# Patient Record
Sex: Female | Born: 1975
Health system: Southern US, Community
[De-identification: ages and names within clinical notes are randomized; demographics above are authoritative.]

---

## 2001-10-06 ENCOUNTER — Other Ambulatory Visit: Admission: RE | Admit: 2001-10-06 | Discharge: 2001-10-06 | Payer: Self-pay | Admitting: Obstetrics and Gynecology

## 2003-03-14 ENCOUNTER — Other Ambulatory Visit: Admission: RE | Admit: 2003-03-14 | Discharge: 2003-03-14 | Payer: Self-pay | Admitting: Obstetrics and Gynecology

## 2004-05-17 ENCOUNTER — Inpatient Hospital Stay (HOSPITAL_COMMUNITY): Admission: AD | Admit: 2004-05-17 | Discharge: 2004-05-18 | Payer: Self-pay | Admitting: Obstetrics and Gynecology

## 2004-05-18 ENCOUNTER — Inpatient Hospital Stay (HOSPITAL_COMMUNITY): Admission: AD | Admit: 2004-05-18 | Discharge: 2004-05-21 | Payer: Self-pay | Admitting: Obstetrics and Gynecology

## 2004-08-02 ENCOUNTER — Inpatient Hospital Stay (HOSPITAL_COMMUNITY): Admission: AD | Admit: 2004-08-02 | Discharge: 2004-08-02 | Payer: Self-pay | Admitting: Obstetrics and Gynecology

## 2004-08-16 ENCOUNTER — Encounter: Admission: RE | Admit: 2004-08-16 | Discharge: 2004-09-15 | Payer: Self-pay | Admitting: Obstetrics and Gynecology

## 2004-08-22 ENCOUNTER — Encounter: Admission: RE | Admit: 2004-08-22 | Discharge: 2004-08-22 | Payer: Self-pay | Admitting: Obstetrics and Gynecology

## 2004-10-11 ENCOUNTER — Other Ambulatory Visit: Admission: RE | Admit: 2004-10-11 | Discharge: 2004-10-11 | Payer: Self-pay | Admitting: Obstetrics and Gynecology

## 2010-09-26 ENCOUNTER — Encounter (HOSPITAL_COMMUNITY): Payer: Self-pay

## 2010-09-26 ENCOUNTER — Emergency Department (HOSPITAL_COMMUNITY)
Admission: EM | Admit: 2010-09-26 | Discharge: 2010-09-26 | Disposition: A | Discharge status: Home / Self Care | Payer: 59 | Attending: Emergency Medicine | Admitting: Emergency Medicine

## 2010-09-26 ENCOUNTER — Emergency Department (HOSPITAL_COMMUNITY): Payer: 59

## 2010-09-26 DIAGNOSIS — R002 Palpitations: Secondary | ICD-10-CM | POA: Insufficient documentation

## 2010-09-26 LAB — URINALYSIS, ROUTINE W REFLEX MICROSCOPIC
Ketones, ur: NEGATIVE mg/dL
Nitrite: NEGATIVE
Protein, ur: NEGATIVE mg/dL
Urine Glucose, Fasting: NEGATIVE mg/dL
Urobilinogen, UA: 0.2 mg/dL (ref 0.0–1.0)

## 2010-09-26 LAB — BASIC METABOLIC PANEL
BUN: 10 mg/dL (ref 6–23)
Chloride: 106 mEq/L (ref 96–112)
Creatinine, Ser: 0.73 mg/dL (ref 0.4–1.2)
GFR calc non Af Amer: >60 mL/min (ref >60)
Glucose, Bld: 100 mg/dL — ABNORMAL HIGH (ref 70–99)
Potassium: 3.7 mEq/L (ref 3.5–5.1)

## 2010-09-26 LAB — DIFFERENTIAL
Basophils Relative: 0 % (ref 0–1)
Eosinophils Absolute: 0.1 10*3/uL (ref 0.0–0.7)
Eosinophils Relative: 1 % (ref 0–5)
Lymphs Abs: 3 10*3/uL (ref 0.7–4.0)
Neutrophils Relative %: 49 % (ref 43–77)

## 2010-09-26 LAB — CBC
MCV: 90.5 fL (ref 78.0–100.0)
Platelets: 268 10*3/uL (ref 150–400)
RBC: 4.33 MIL/uL (ref 3.87–5.11)
RDW: 13.3 % (ref 11.5–15.5)
WBC: 6.5 10*3/uL (ref 4.0–10.5)

## 2010-09-26 LAB — POCT CARDIAC MARKERS: CKMB, poc: <1 ng/mL — ABNORMAL LOW (ref 1.0–8.0)

## 2010-09-26 LAB — POCT PREGNANCY, URINE: Preg Test, Ur: NEGATIVE

## 2011-02-26 ENCOUNTER — Other Ambulatory Visit: Payer: Self-pay | Admitting: Family Medicine

## 2011-02-27 ENCOUNTER — Ambulatory Visit
Admission: RE | Admit: 2011-02-27 | Discharge: 2011-02-27 | Disposition: A | Discharge status: Home / Self Care | Payer: 59 | Source: Ambulatory Visit | Attending: Family Medicine | Admitting: Family Medicine

## 2011-04-01 ENCOUNTER — Emergency Department (HOSPITAL_COMMUNITY)
Admission: EM | Admit: 2011-04-01 | Discharge: 2011-04-01 | Disposition: A | Discharge status: Home / Self Care | Payer: 59 | Attending: Emergency Medicine | Admitting: Emergency Medicine

## 2011-04-01 DIAGNOSIS — R079 Chest pain, unspecified: Secondary | ICD-10-CM | POA: Insufficient documentation

## 2011-04-01 DIAGNOSIS — R109 Unspecified abdominal pain: Secondary | ICD-10-CM | POA: Insufficient documentation

## 2011-04-01 DIAGNOSIS — R0609 Other forms of dyspnea: Secondary | ICD-10-CM | POA: Insufficient documentation

## 2011-04-01 DIAGNOSIS — R0989 Other specified symptoms and signs involving the circulatory and respiratory systems: Secondary | ICD-10-CM | POA: Insufficient documentation

## 2013-01-12 IMAGING — US US ABDOMEN COMPLETE
1 series · 14 of 25 positions shown · non-contrast
Comparison: None.

CLINICAL DATA: Abdominal pain.  Question gallstones.

COMPLETE ABDOMINAL ULTRASOUND

[Series 1: us abdomen complete · 0.19mm/px · 14 of 64 slices shown]
[im 1/64]
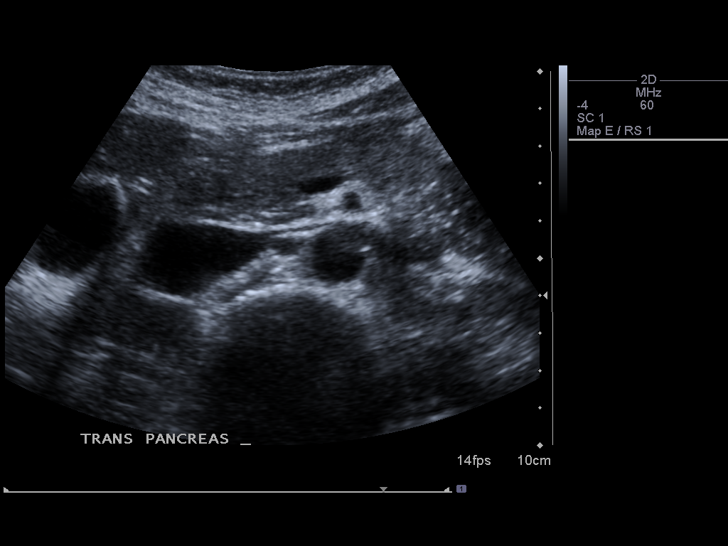
[im 6/64]
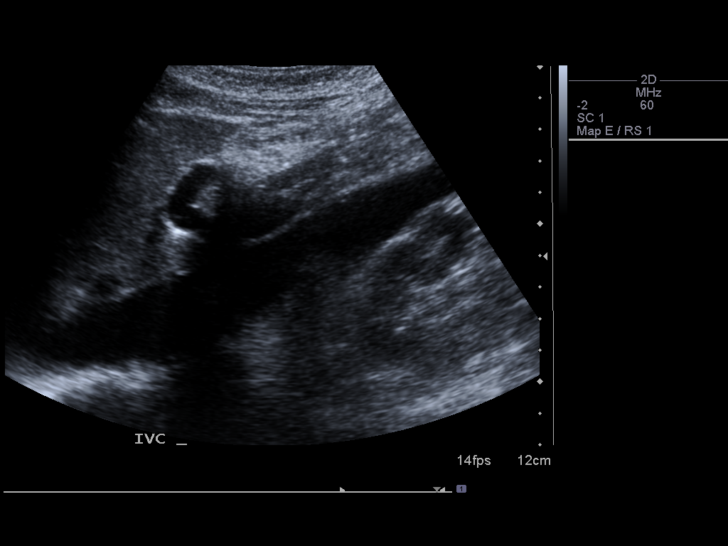
[im 11/64]
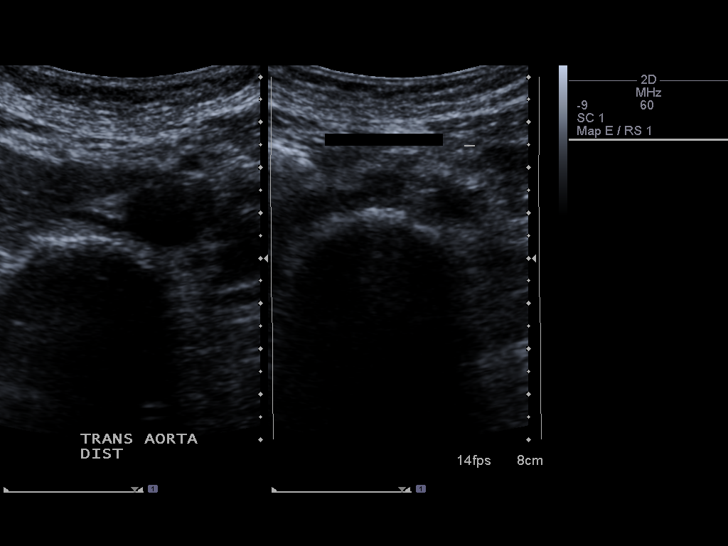
[im 16/64]
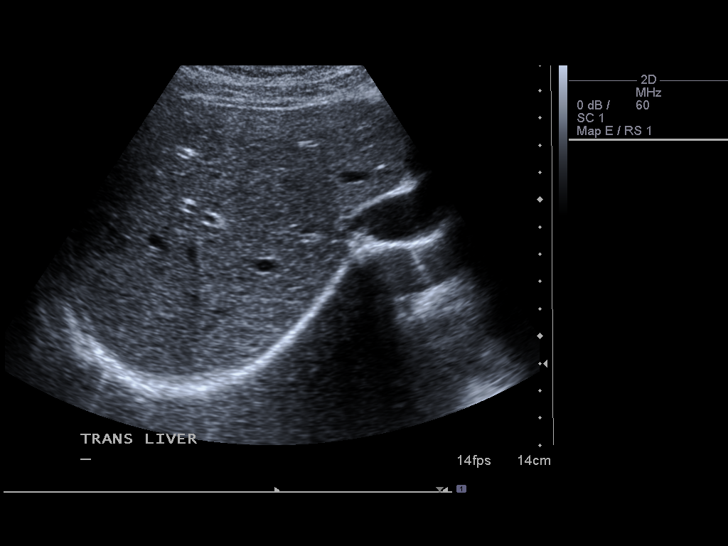
[im 22/64]
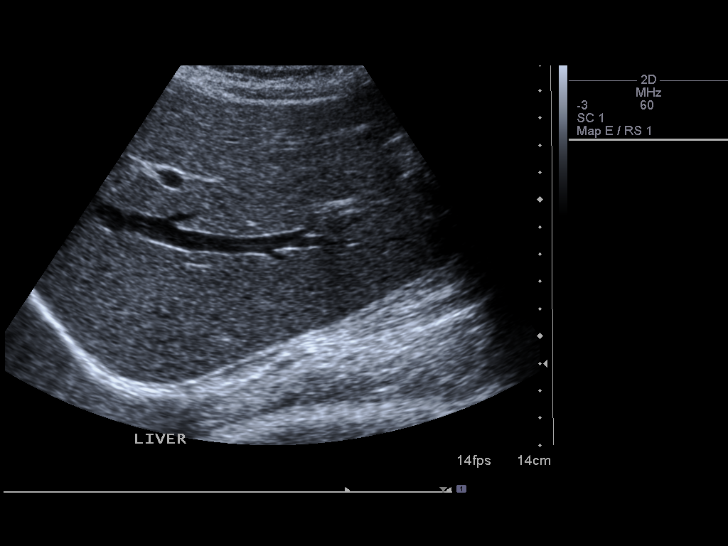
[im 24/64]
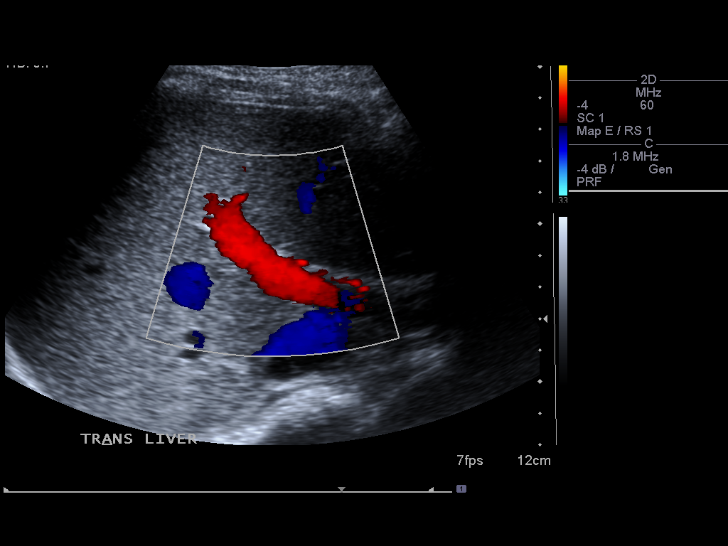
[im 29/64]
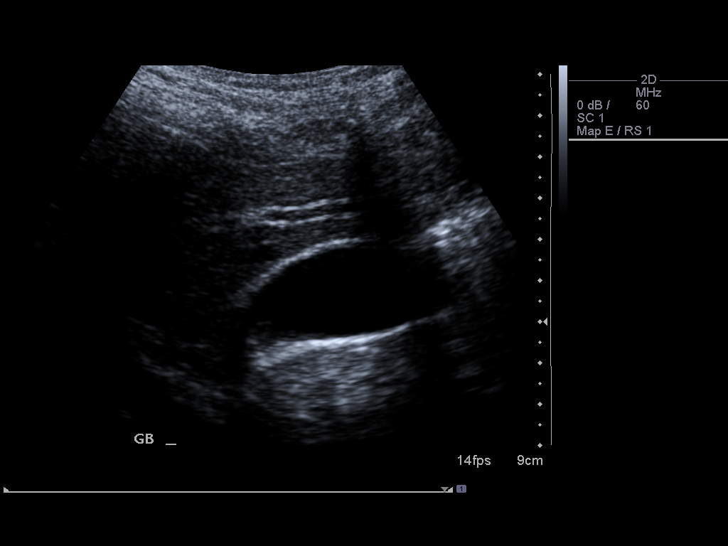
[im 35/64]
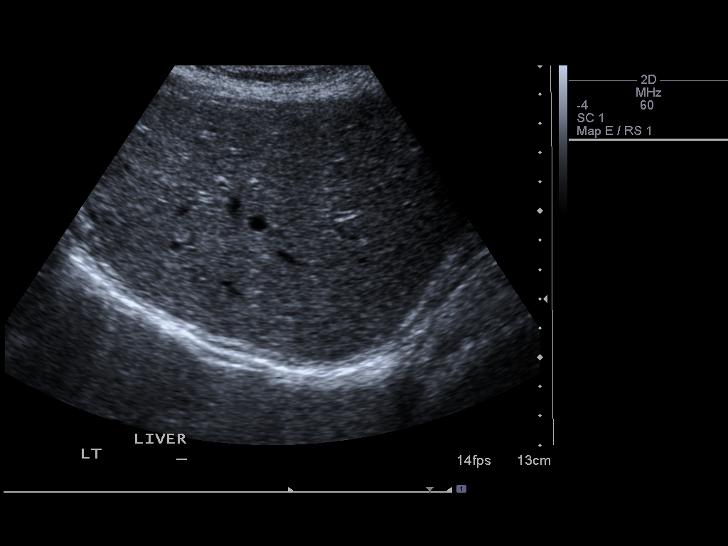
[im 40/64]
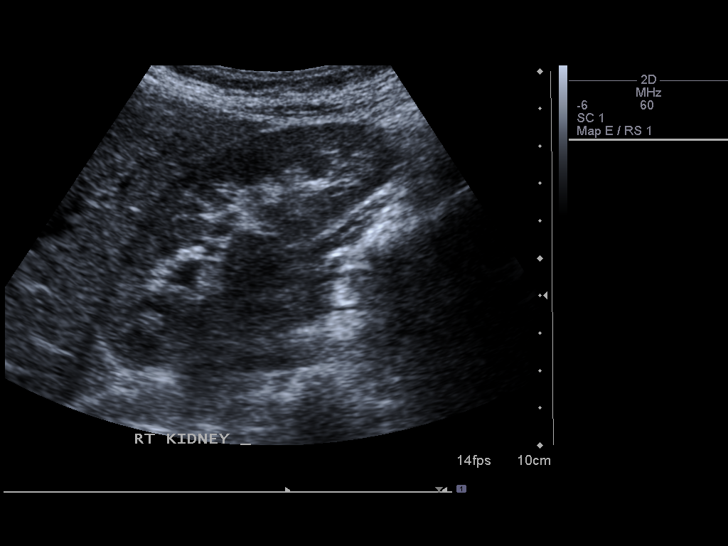
[im 43/64]
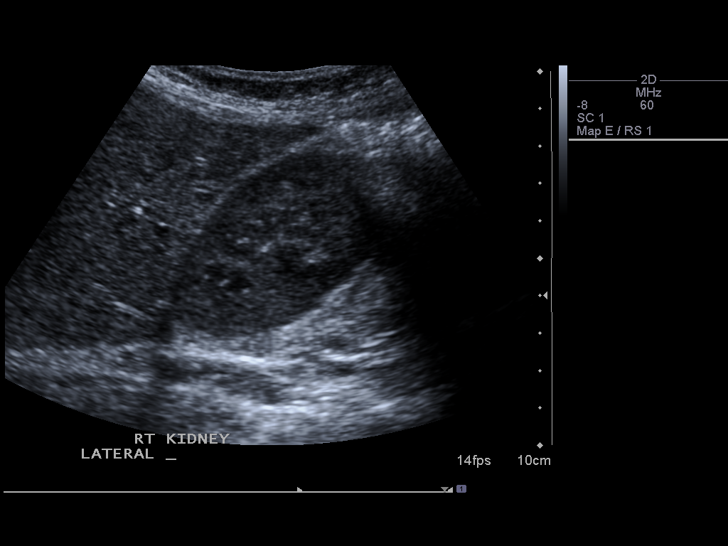
[im 48/64]
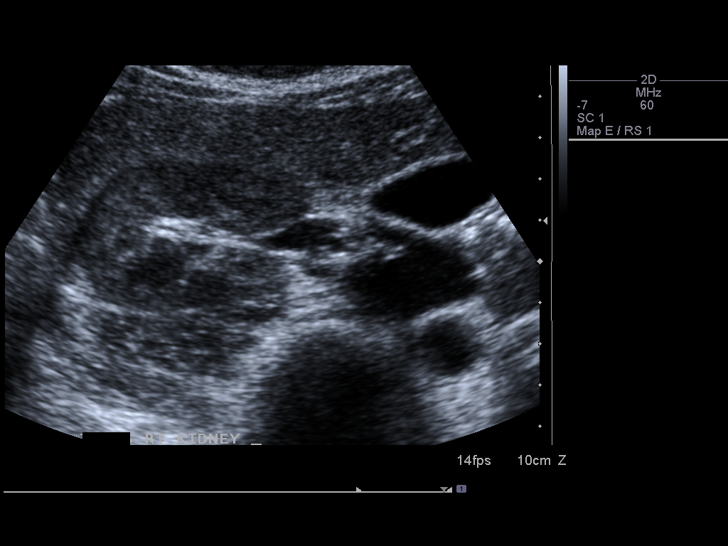
[im 53/64]
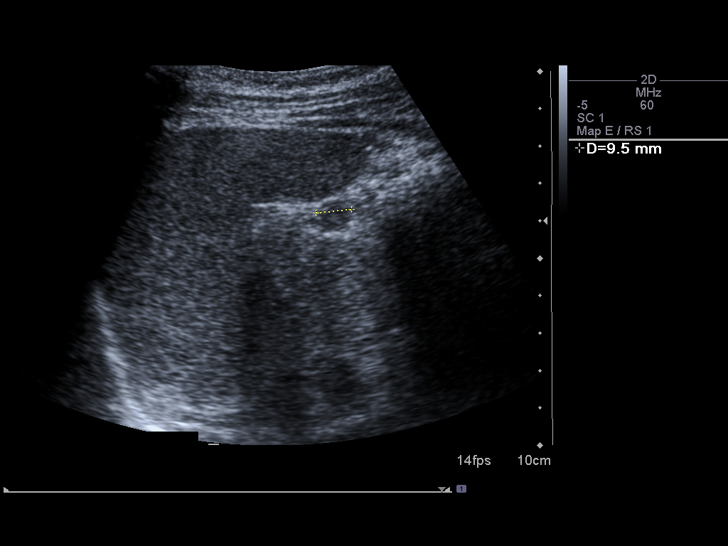
[im 58/64]
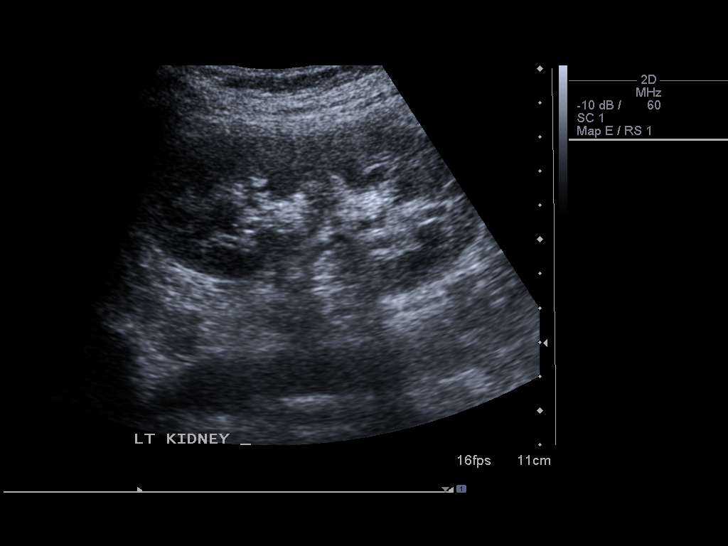
[im 64/64]
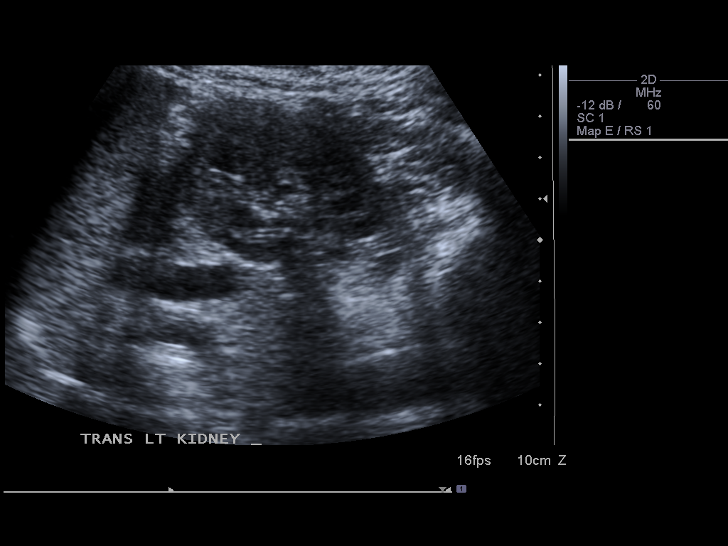

[14 of 25 positions shown; findings below may reference images not displayed]

FINDINGS: Gallbladder:  Normal, without wall thickening, stone, or
pericholecystic fluid.  Sonographic Murphy's sign was not elicited.

Common bile duct: Normal, at 4 mm.

Liver: Normal in echogenicity, without focal lesion.

IVC: Negative

Pancreas:  Negative

Spleen:  Normal in size and echogenicity.

Right Kidney:  9.7 cm. No hydronephrosis.

Left Kidney:  10.2 cm. No hydronephrosis.

Abdominal aorta:  Nonaneurysmal without ascites.
IMPRESSION: Normal abdominal ultrasound.  No explanation for pain.

## 2017-02-20 DIAGNOSIS — D2371 Other benign neoplasm of skin of right lower limb, including hip: Secondary | ICD-10-CM | POA: Diagnosis not present

## 2017-02-20 DIAGNOSIS — D2372 Other benign neoplasm of skin of left lower limb, including hip: Secondary | ICD-10-CM | POA: Diagnosis not present

## 2017-06-25 DIAGNOSIS — M76822 Posterior tibial tendinitis, left leg: Secondary | ICD-10-CM | POA: Diagnosis not present

## 2017-06-25 DIAGNOSIS — M7918 Myalgia, other site: Secondary | ICD-10-CM | POA: Diagnosis not present

## 2017-06-25 DIAGNOSIS — M722 Plantar fascial fibromatosis: Secondary | ICD-10-CM | POA: Diagnosis not present

## 2017-06-25 DIAGNOSIS — M9906 Segmental and somatic dysfunction of lower extremity: Secondary | ICD-10-CM | POA: Diagnosis not present

## 2017-06-29 DIAGNOSIS — M9906 Segmental and somatic dysfunction of lower extremity: Secondary | ICD-10-CM | POA: Diagnosis not present

## 2017-06-29 DIAGNOSIS — M722 Plantar fascial fibromatosis: Secondary | ICD-10-CM | POA: Diagnosis not present

## 2017-06-29 DIAGNOSIS — M76822 Posterior tibial tendinitis, left leg: Secondary | ICD-10-CM | POA: Diagnosis not present

## 2017-06-29 DIAGNOSIS — M7918 Myalgia, other site: Secondary | ICD-10-CM | POA: Diagnosis not present

## 2017-07-01 DIAGNOSIS — M7918 Myalgia, other site: Secondary | ICD-10-CM | POA: Diagnosis not present

## 2017-07-01 DIAGNOSIS — M722 Plantar fascial fibromatosis: Secondary | ICD-10-CM | POA: Diagnosis not present

## 2017-07-01 DIAGNOSIS — M76822 Posterior tibial tendinitis, left leg: Secondary | ICD-10-CM | POA: Diagnosis not present

## 2017-07-01 DIAGNOSIS — M9906 Segmental and somatic dysfunction of lower extremity: Secondary | ICD-10-CM | POA: Diagnosis not present

## 2017-07-09 DIAGNOSIS — M7918 Myalgia, other site: Secondary | ICD-10-CM | POA: Diagnosis not present

## 2017-07-09 DIAGNOSIS — M76822 Posterior tibial tendinitis, left leg: Secondary | ICD-10-CM | POA: Diagnosis not present

## 2017-07-09 DIAGNOSIS — M722 Plantar fascial fibromatosis: Secondary | ICD-10-CM | POA: Diagnosis not present

## 2017-07-09 DIAGNOSIS — M9906 Segmental and somatic dysfunction of lower extremity: Secondary | ICD-10-CM | POA: Diagnosis not present

## 2017-07-23 DIAGNOSIS — M9906 Segmental and somatic dysfunction of lower extremity: Secondary | ICD-10-CM | POA: Diagnosis not present

## 2017-07-23 DIAGNOSIS — M76822 Posterior tibial tendinitis, left leg: Secondary | ICD-10-CM | POA: Diagnosis not present

## 2017-07-23 DIAGNOSIS — M722 Plantar fascial fibromatosis: Secondary | ICD-10-CM | POA: Diagnosis not present

## 2017-07-23 DIAGNOSIS — M7918 Myalgia, other site: Secondary | ICD-10-CM | POA: Diagnosis not present

## 2017-07-29 DIAGNOSIS — M7918 Myalgia, other site: Secondary | ICD-10-CM | POA: Diagnosis not present

## 2017-07-29 DIAGNOSIS — M722 Plantar fascial fibromatosis: Secondary | ICD-10-CM | POA: Diagnosis not present

## 2017-07-29 DIAGNOSIS — M9906 Segmental and somatic dysfunction of lower extremity: Secondary | ICD-10-CM | POA: Diagnosis not present

## 2017-07-29 DIAGNOSIS — M76822 Posterior tibial tendinitis, left leg: Secondary | ICD-10-CM | POA: Diagnosis not present

## 2017-08-06 DIAGNOSIS — M76822 Posterior tibial tendinitis, left leg: Secondary | ICD-10-CM | POA: Diagnosis not present

## 2017-08-06 DIAGNOSIS — M9906 Segmental and somatic dysfunction of lower extremity: Secondary | ICD-10-CM | POA: Diagnosis not present

## 2017-08-06 DIAGNOSIS — M722 Plantar fascial fibromatosis: Secondary | ICD-10-CM | POA: Diagnosis not present

## 2017-08-06 DIAGNOSIS — M7918 Myalgia, other site: Secondary | ICD-10-CM | POA: Diagnosis not present

## 2017-08-13 DIAGNOSIS — M7918 Myalgia, other site: Secondary | ICD-10-CM | POA: Diagnosis not present

## 2017-08-13 DIAGNOSIS — M722 Plantar fascial fibromatosis: Secondary | ICD-10-CM | POA: Diagnosis not present

## 2017-08-13 DIAGNOSIS — M76822 Posterior tibial tendinitis, left leg: Secondary | ICD-10-CM | POA: Diagnosis not present

## 2017-08-13 DIAGNOSIS — M9906 Segmental and somatic dysfunction of lower extremity: Secondary | ICD-10-CM | POA: Diagnosis not present

## 2017-08-27 DIAGNOSIS — M722 Plantar fascial fibromatosis: Secondary | ICD-10-CM | POA: Diagnosis not present

## 2017-08-27 DIAGNOSIS — M9906 Segmental and somatic dysfunction of lower extremity: Secondary | ICD-10-CM | POA: Diagnosis not present

## 2017-08-27 DIAGNOSIS — M76822 Posterior tibial tendinitis, left leg: Secondary | ICD-10-CM | POA: Diagnosis not present

## 2017-08-27 DIAGNOSIS — M7918 Myalgia, other site: Secondary | ICD-10-CM | POA: Diagnosis not present

## 2017-09-10 DIAGNOSIS — M7918 Myalgia, other site: Secondary | ICD-10-CM | POA: Diagnosis not present

## 2017-09-10 DIAGNOSIS — M9906 Segmental and somatic dysfunction of lower extremity: Secondary | ICD-10-CM | POA: Diagnosis not present

## 2017-09-10 DIAGNOSIS — M722 Plantar fascial fibromatosis: Secondary | ICD-10-CM | POA: Diagnosis not present

## 2017-09-10 DIAGNOSIS — M76822 Posterior tibial tendinitis, left leg: Secondary | ICD-10-CM | POA: Diagnosis not present

## 2017-09-21 DIAGNOSIS — M76822 Posterior tibial tendinitis, left leg: Secondary | ICD-10-CM | POA: Diagnosis not present

## 2017-09-21 DIAGNOSIS — M722 Plantar fascial fibromatosis: Secondary | ICD-10-CM | POA: Diagnosis not present

## 2017-09-21 DIAGNOSIS — M7918 Myalgia, other site: Secondary | ICD-10-CM | POA: Diagnosis not present

## 2017-09-21 DIAGNOSIS — M9906 Segmental and somatic dysfunction of lower extremity: Secondary | ICD-10-CM | POA: Diagnosis not present

## 2017-10-05 DIAGNOSIS — M5416 Radiculopathy, lumbar region: Secondary | ICD-10-CM | POA: Diagnosis not present

## 2017-10-05 DIAGNOSIS — M722 Plantar fascial fibromatosis: Secondary | ICD-10-CM | POA: Diagnosis not present

## 2017-10-05 DIAGNOSIS — M76822 Posterior tibial tendinitis, left leg: Secondary | ICD-10-CM | POA: Diagnosis not present

## 2017-10-05 DIAGNOSIS — M9903 Segmental and somatic dysfunction of lumbar region: Secondary | ICD-10-CM | POA: Diagnosis not present

## 2017-10-28 DIAGNOSIS — M9903 Segmental and somatic dysfunction of lumbar region: Secondary | ICD-10-CM | POA: Diagnosis not present

## 2017-10-28 DIAGNOSIS — M76822 Posterior tibial tendinitis, left leg: Secondary | ICD-10-CM | POA: Diagnosis not present

## 2017-10-28 DIAGNOSIS — M5416 Radiculopathy, lumbar region: Secondary | ICD-10-CM | POA: Diagnosis not present

## 2017-10-28 DIAGNOSIS — M722 Plantar fascial fibromatosis: Secondary | ICD-10-CM | POA: Diagnosis not present

## 2017-11-05 DIAGNOSIS — M9903 Segmental and somatic dysfunction of lumbar region: Secondary | ICD-10-CM | POA: Diagnosis not present

## 2017-11-05 DIAGNOSIS — M76822 Posterior tibial tendinitis, left leg: Secondary | ICD-10-CM | POA: Diagnosis not present

## 2017-11-05 DIAGNOSIS — M5416 Radiculopathy, lumbar region: Secondary | ICD-10-CM | POA: Diagnosis not present

## 2017-11-05 DIAGNOSIS — M722 Plantar fascial fibromatosis: Secondary | ICD-10-CM | POA: Diagnosis not present

## 2017-11-12 DIAGNOSIS — M76822 Posterior tibial tendinitis, left leg: Secondary | ICD-10-CM | POA: Diagnosis not present

## 2017-11-12 DIAGNOSIS — M5416 Radiculopathy, lumbar region: Secondary | ICD-10-CM | POA: Diagnosis not present

## 2017-11-12 DIAGNOSIS — M9903 Segmental and somatic dysfunction of lumbar region: Secondary | ICD-10-CM | POA: Diagnosis not present

## 2017-11-12 DIAGNOSIS — M722 Plantar fascial fibromatosis: Secondary | ICD-10-CM | POA: Diagnosis not present

## 2017-11-23 DIAGNOSIS — M76822 Posterior tibial tendinitis, left leg: Secondary | ICD-10-CM | POA: Diagnosis not present

## 2017-11-23 DIAGNOSIS — M5416 Radiculopathy, lumbar region: Secondary | ICD-10-CM | POA: Diagnosis not present

## 2017-11-23 DIAGNOSIS — M9903 Segmental and somatic dysfunction of lumbar region: Secondary | ICD-10-CM | POA: Diagnosis not present

## 2017-11-23 DIAGNOSIS — M722 Plantar fascial fibromatosis: Secondary | ICD-10-CM | POA: Diagnosis not present

## 2017-12-23 DIAGNOSIS — M9903 Segmental and somatic dysfunction of lumbar region: Secondary | ICD-10-CM | POA: Diagnosis not present

## 2017-12-23 DIAGNOSIS — M722 Plantar fascial fibromatosis: Secondary | ICD-10-CM | POA: Diagnosis not present

## 2017-12-23 DIAGNOSIS — M76822 Posterior tibial tendinitis, left leg: Secondary | ICD-10-CM | POA: Diagnosis not present

## 2017-12-23 DIAGNOSIS — M5416 Radiculopathy, lumbar region: Secondary | ICD-10-CM | POA: Diagnosis not present

## 2018-01-20 DIAGNOSIS — M9903 Segmental and somatic dysfunction of lumbar region: Secondary | ICD-10-CM | POA: Diagnosis not present

## 2018-01-20 DIAGNOSIS — M5416 Radiculopathy, lumbar region: Secondary | ICD-10-CM | POA: Diagnosis not present

## 2018-01-20 DIAGNOSIS — M76822 Posterior tibial tendinitis, left leg: Secondary | ICD-10-CM | POA: Diagnosis not present

## 2018-01-20 DIAGNOSIS — M722 Plantar fascial fibromatosis: Secondary | ICD-10-CM | POA: Diagnosis not present

## 2018-02-08 DIAGNOSIS — M722 Plantar fascial fibromatosis: Secondary | ICD-10-CM | POA: Diagnosis not present

## 2018-02-08 DIAGNOSIS — M9903 Segmental and somatic dysfunction of lumbar region: Secondary | ICD-10-CM | POA: Diagnosis not present

## 2018-02-08 DIAGNOSIS — M5416 Radiculopathy, lumbar region: Secondary | ICD-10-CM | POA: Diagnosis not present

## 2018-02-08 DIAGNOSIS — M76822 Posterior tibial tendinitis, left leg: Secondary | ICD-10-CM | POA: Diagnosis not present

## 2018-03-03 DIAGNOSIS — M76822 Posterior tibial tendinitis, left leg: Secondary | ICD-10-CM | POA: Diagnosis not present

## 2018-03-03 DIAGNOSIS — M9903 Segmental and somatic dysfunction of lumbar region: Secondary | ICD-10-CM | POA: Diagnosis not present

## 2018-03-03 DIAGNOSIS — M5416 Radiculopathy, lumbar region: Secondary | ICD-10-CM | POA: Diagnosis not present

## 2018-03-03 DIAGNOSIS — M722 Plantar fascial fibromatosis: Secondary | ICD-10-CM | POA: Diagnosis not present

## 2018-04-07 DIAGNOSIS — M9903 Segmental and somatic dysfunction of lumbar region: Secondary | ICD-10-CM | POA: Diagnosis not present

## 2018-04-07 DIAGNOSIS — M76822 Posterior tibial tendinitis, left leg: Secondary | ICD-10-CM | POA: Diagnosis not present

## 2018-04-07 DIAGNOSIS — M722 Plantar fascial fibromatosis: Secondary | ICD-10-CM | POA: Diagnosis not present

## 2018-04-07 DIAGNOSIS — M5416 Radiculopathy, lumbar region: Secondary | ICD-10-CM | POA: Diagnosis not present

## 2018-04-21 ENCOUNTER — Ambulatory Visit (INDEPENDENT_AMBULATORY_CARE_PROVIDER_SITE_OTHER): Payer: BLUE CROSS/BLUE SHIELD | Admitting: Family Medicine

## 2018-04-21 VITALS — BP 112/76 | Ht 68.0 in | Wt 170.0 lb

## 2018-04-21 DIAGNOSIS — S99922A Unspecified injury of left foot, initial encounter: Secondary | ICD-10-CM

## 2018-04-21 NOTE — Patient Instructions (Signed)
You partially tore your plantar fascia.   Your posterior tibialis and flexor hallucis tendons are intact and there's no evidence of remote damage to this. Take tylenol and/or aleve as needed for pain  Plantar fascia stretch for 20-30 seconds (do 3 of these) in morning Lowering/raise on a step exercises 3 x 10 once or twice a day - this is very important for long term recovery. Theraband strengthening 3 sets of 10 once a day also. Can add heel walks, toe walks forward and backward as well Ice heel for 15 minutes as needed. Avoid flat shoes/barefoot walking as much as possible. Arch straps have been shown to help with pain. Inserts are important (spencos, our green insoles, custom orthotics). Physical therapy is also an option. Follow up with me in 6 weeks. Consider nitro patches as well.

## 2018-04-22 ENCOUNTER — Encounter: Payer: Self-pay | Admitting: Family Medicine

## 2018-04-22 NOTE — Progress Notes (Signed)
PCP: System, Pcp Not In  Subjective:   HPI: Patient is a 42 y.o. female here for left foot pain.  Patient reports she's had at least 15 months of pain in left medial ankle and arch. Had some soreness initially then while playing kickball she planted with left foot and felt a sharp pain medial left arch. Had swelling, bruising at the time. Has been working with [employer] and has improved but still cannot do sideways motions, run without feeling some pain and that the foot feels unstable. Pain can be sharp still medial foot and ankle. Tried new shoes, superfeet insoles, theraband strengthening. No numbness.  History reviewed. No pertinent past medical history.  No current outpatient medications on file prior to visit.   No current facility-administered medications on file prior to visit.     History reviewed. No pertinent surgical history.  Allergies  Allergen Reactions  . Amoxicillin Hives    Tongue swells  . Penicillins Hives    Tongue swells  . Codeine Other (See Comments)    hallucinations    Social History   Socioeconomic History  . Marital status: Married    Spouse name: Not on file  . Number of children: Not on file  . Years of education: Not on file  . Highest education level: Not on file  Occupational History  . Not on file  Social Needs  . Financial resource strain: Not on file  . Food insecurity:    Worry: Not on file    Inability: Not on file  . Transportation needs:    Medical: Not on file    Non-medical: Not on file  Tobacco Use  . Smoking status: Not on file  Substance and Sexual Activity  . Alcohol use: Not on file  . Drug use: Not on file  . Sexual activity: Not on file  Lifestyle  . Physical activity:    Days per week: Not on file    Minutes per session: Not on file  . Stress: Not on file  Relationships  . Social connections:    Talks on phone: Not on file    Gets together: Not on file    Attends religious service: Not on file     Active member of club or organization: Not on file    Attends meetings of clubs or organizations: Not on file    Relationship status: Not on file  . Intimate partner violence:    Fear of current or ex partner: Not on file    Emotionally abused: Not on file    Physically abused: Not on file    Forced sexual activity: Not on file  Other Topics Concern  . Not on file  Social History Narrative  . Not on file    History reviewed. No pertinent family history.  BP 112/76   Ht 5\' 8"  (1.727 m)   Wt 170 lb (77.1 kg)   BMI 25.85 kg/m   Review of Systems: See HPI above.     Objective:  Physical Exam:  Gen: NAD, comfortable in exam room  Left foot/ankle: No gross deformity, swelling, ecchymoses FROM with 5/5 strength without pain of ankle - mild pain on resisted flexion of great toe. TTP medial portion of plantar fascia.  No other tenderness. Negative ant drawer and talar tilt.   Negative syndesmotic compression. Thompsons test negative. NV intact distally.  Right foot/ankle: No deformity. FROM with 5/5 strength. No tenderness to palpation. NVI distally.   MSK u/s left foot/ankle:  No cortical irregularities of 1st metatarsal, 1st digit, medial malleolus.  Flexor hallucis longus, posterior tibialis tendons intact without evidence of tear, tenosynovitis.  Plantar fascia with noted thickening mid-substance medially.  No residual tearing.  Assessment & Plan:  1. Left foot/ankle injury - remote, over 15 months ago.  Her history and ultrasound consistent with remote partial tear of plantar fascia with healing and scar tissue formation.  No evidence of tears to post tib or flexor hallucis.  Reassured patient.  Icing, tylenol, aleve only if needed.  Arch binders, reviewed home exercise program to do daily.  Stressed importance of arch supports.  Consider nitro patches.  F/u in 6 weeks.
